# Patient Record
Sex: Male | Born: 1995 | Race: Black or African American | Hispanic: No | Smoking: Current some day smoker
Health system: Southern US, Community
[De-identification: ages and names within clinical notes are randomized; demographics above are authoritative.]

## PROBLEM LIST (undated history)

## (undated) DIAGNOSIS — J45909 Unspecified asthma, uncomplicated: Secondary | ICD-10-CM

## (undated) DIAGNOSIS — Z91018 Allergy to other foods: Secondary | ICD-10-CM

## (undated) DIAGNOSIS — Z9109 Other allergy status, other than to drugs and biological substances: Secondary | ICD-10-CM

---

## 2015-03-19 ENCOUNTER — Emergency Department (INDEPENDENT_AMBULATORY_CARE_PROVIDER_SITE_OTHER)
Admission: EM | Admit: 2015-03-19 | Discharge: 2015-03-19 | Disposition: A | Discharge status: Home / Self Care | Payer: Self-pay | Source: Home / Self Care | Attending: Family Medicine | Admitting: Family Medicine

## 2015-03-19 ENCOUNTER — Encounter (HOSPITAL_COMMUNITY): Payer: Self-pay | Admitting: Family Medicine

## 2015-03-19 DIAGNOSIS — R509 Fever, unspecified: Secondary | ICD-10-CM

## 2015-03-19 DIAGNOSIS — B349 Viral infection, unspecified: Secondary | ICD-10-CM

## 2015-03-19 HISTORY — DX: Unspecified asthma, uncomplicated: J45.909

## 2015-03-19 HISTORY — DX: Other allergy status, other than to drugs and biological substances: Z91.09

## 2015-03-19 HISTORY — DX: Allergy to other foods: Z91.018

## 2015-03-19 LAB — POCT RAPID STREP A: STREPTOCOCCUS, GROUP A SCREEN (DIRECT): NEGATIVE

## 2015-03-19 MED ORDER — IBUPROFEN 800 MG PO TABS
ORAL_TABLET | ORAL | Status: AC
  Filled 2015-03-19: qty 1

## 2015-03-19 MED ORDER — IBUPROFEN 800 MG PO TABS
800.0000 mg | ORAL_TABLET | Freq: Once | ORAL | Status: AC
  Administered 2015-03-19: 800 mg via ORAL

## 2015-03-19 MED ORDER — IBUPROFEN 800 MG PO TABS: 800.0000 mg | ORAL_TABLET | Freq: Once | ORAL | Status: DC

## 2015-03-19 NOTE — ED Notes (Signed)
C/o sore throat, body aches, chills and dizzy at times onset early this morning.  C/o feeling weak.

## 2015-03-19 NOTE — ED Notes (Signed)
[facility]  Pt. does not know the phone number.

## 2015-03-19 NOTE — ED Provider Notes (Signed)
CSN: [id number]     Arrival date & time 03/19/15  1746 History   First MD Initiated Contact with Patient 03/19/15 1817     Chief Complaint  Patient presents with  . Generalized Body Aches  . Sore Throat   (Consider location/radiation/quality/duration/timing/severity/associated sxs/prior Treatment) HPI  Back pain. Current episode started today. Off and on for 18 mo. unsure of trigger. Tylenol w/o benefit.  Also w/ dizziness and lightheadedness. Typically associated w/ going from sitting or lying to standing.  Associated w/ chills and generalized body aches, sore throat Denies nausea, vomiting, vomiting, dysuria, frequency, headache, runny nose, upper story congestion No drug use for 4 mo.    Past Medical History  Diagnosis Date  . Environmental allergies   . Asthma    History reviewed. No pertinent past surgical history. Family History  Problem Relation Age of Onset  . Asthma Mother   . Hypertension Father    History  Substance Use Topics  . Smoking status: Former Smoker    Quit date: 12/09/2014  . Smokeless tobacco: Not on file  . Alcohol Use: Not on file    Review of Systems Per HPI with all other pertinent systems negative.   Allergies  Review of patient's allergies indicates no known allergies.  Home Medications   Prior to Admission medications   Not on File   There were no vitals taken for this visit. Physical Exam Physical Exam  Constitutional: oriented to person, place, and time. appears well-developed and well-nourished. No distress.  HENT:  Head: Normocephalic and atraumatic.  Tonsils 2+ with pharyngeal injection Eyes: EOMI. PERRL.  Neck: Normal range of motion.  Cardiovascular: RRR, no m/r/g, 2+ distal pulses,  Pulmonary/Chest: Effort normal and breath sounds normal. No respiratory distress.  Abdominal: Soft. Bowel sounds are normal. NonTTP, no distension.  Musculoskeletal: Normal range of motion. Non ttp, no effusion.  Neurological: alert and  oriented to person, place, and time.  Skin: Skin is warm. No rash noted. non diaphoretic.  Psychiatric: normal mood and affect. behavior is normal. Judgment and thought content normal.      ED Course  Procedures (including critical care time) Labs Review Labs Reviewed  POCT RAPID STREP A ([facility] ONLY)    Imaging Review No results found.   MDM   1. Viral illness   2. Febrile illness    Rapid strep neg Motrin 800 mg by mouth given in clinic. Orthostatic vital signs normal Patient to get plenty of rest, use anti-inflammatories for pain and fevers, stable hydrated. Work note provided.      Ozella Rocksavid J Merrell, MD 03/19/15 [phone]

## 2015-03-19 NOTE — Discharge Instructions (Signed)
You are likely suffering from a viral illness. We will call you if your second strep test comes back positive for infection Please get plenty of rest and stay well hydrated.  Please use ibuprofen 800mg  every 8 hours for pain and fevers.

## 2015-03-21 LAB — CULTURE, GROUP A STREP: Strep A Culture: NEGATIVE

## 2015-07-12 ENCOUNTER — Emergency Department (INDEPENDENT_AMBULATORY_CARE_PROVIDER_SITE_OTHER): Payer: Self-pay

## 2015-07-12 ENCOUNTER — Emergency Department (INDEPENDENT_AMBULATORY_CARE_PROVIDER_SITE_OTHER)
Admission: EM | Admit: 2015-07-12 | Discharge: 2015-07-12 | Disposition: A | Discharge status: Home / Self Care | Payer: Self-pay | Source: Home / Self Care | Attending: Family Medicine | Admitting: Family Medicine

## 2015-07-12 ENCOUNTER — Encounter (HOSPITAL_COMMUNITY): Payer: Self-pay | Admitting: *Deleted

## 2015-07-12 DIAGNOSIS — S86892A Other injury of other muscle(s) and tendon(s) at lower leg level, left leg, initial encounter: Secondary | ICD-10-CM

## 2015-07-12 NOTE — Discharge Instructions (Signed)
Wear knee brace when walking, may remove in bed. See dr Thurston Hole at 10 am on thurs am for further care.

## 2015-07-12 NOTE — ED Provider Notes (Signed)
[id number]     Arrival date & time 07/12/15  1413 History   First MD Initiated Contact with Patient 07/12/15 1432     Chief Complaint  Patient presents with  . Knee Pain   (Consider location/radiation/quality/duration/timing/severity/associated sxs/prior Treatment) Patient is a 19 y.o. male presenting with knee pain. The history is provided by the patient.  Knee Pain Location:  Knee Time since incident:  3 weeks Injury: yes   Mechanism of injury comment:  Jumped off a loading dock. Knee location:  L knee Pain details:    Quality:  Sharp   Radiates to:  Does not radiate   Severity:  Moderate   Onset quality:  Gradual   Progression:  Worsening Chronicity:  New Dislocation: no   Relieved by:  None tried Worsened by:  Nothing tried Ineffective treatments:  None tried Associated symptoms: decreased ROM, muscle weakness and swelling   Associated symptoms: no numbness     Past Medical History  Diagnosis Date  . Environmental allergies   . Asthma   . Pollen allergies   . Nut allergy    History reviewed. No pertinent past surgical history. Family History  Problem Relation Age of Onset  . Asthma Mother   . Hypertension Father    History  Substance Use Topics  . Smoking status: Former Smoker    Quit date: 11/11/2014  . Smokeless tobacco: Not on file  . Alcohol Use: No    Review of Systems  Constitutional: Negative.   Musculoskeletal: Positive for joint swelling and gait problem.  Skin: Negative.     Allergies  Review of patient's allergies indicates no known allergies.  Home Medications   Prior to Admission medications   Medication Sig Start Date End Date Taking? Authorizing Provider  albuterol (PROVENTIL HFA;VENTOLIN HFA) 108 (90 BASE) MCG/ACT inhaler Inhale into the lungs every 6 (six) hours as needed for wheezing or shortness of breath.    Historical Provider, MD  ibuprofen (ADVIL,MOTRIN) 800 MG tablet Take 1 tablet (800 mg total) by mouth once. 03/19/15    Ozella Rocks, MD   BP 115/69 mmHg  Pulse 66  Temp(Src) 97.8 F (36.6 C) (Oral)  Resp 16  SpO2 99% Physical Exam  Constitutional: He is oriented to person, place, and time. He appears well-developed and well-nourished. No distress.  Musculoskeletal: He exhibits tenderness.       Left knee: He exhibits decreased range of motion, swelling and abnormal patellar mobility. He exhibits no effusion, no erythema, normal alignment, no LCL laxity, normal meniscus and no MCL laxity. Tenderness found. Patellar tendon tenderness noted. No medial joint line and no lateral joint line tenderness noted.  Neurological: He is alert and oriented to person, place, and time.  Skin: Skin is warm and dry.  Nursing note and vitals reviewed.   ED Course  Procedures (including critical care time) Labs Review Labs Reviewed - No data to display  Imaging Review Dg Knee Ap/lat W/sunrise Left  07/12/2015   CLINICAL DATA:  Patellar pain.  Jumping injury.  Initial encounter.  EXAM: LEFT KNEE 3 VIEWS  COMPARISON:  None.  FINDINGS: There is no evidence of fracture, dislocation, or joint effusion. There is no evidence of arthropathy or other focal bone abnormality. Soft tissues are unremarkable.  IMPRESSION: Negative.   Electronically Signed   By: Andreas Newport M.D.   On: 07/12/2015 15:14    X-rays reviewed and report per radiologist.  MDM   1. Patellar tendon avulsion, left, initial encounter  Linna Hoff, MD 07/12/15 [phone]

## 2015-07-12 NOTE — ED Notes (Signed)
Pt   Reports       Pain      l  Knee  For    About 3   Weeks         He  States  He  Sustained  An injury    From  Jumping   Off  A    Loading  Dock                He states     Pain is  Worse  On  Weight  Bearing

## 2016-03-29 ENCOUNTER — Encounter (HOSPITAL_COMMUNITY): Payer: Self-pay | Admitting: *Deleted

## 2016-03-29 ENCOUNTER — Emergency Department (HOSPITAL_COMMUNITY)
Admission: EM | Admit: 2016-03-29 | Discharge: 2016-03-29 | Disposition: A | Discharge status: Home / Self Care | Payer: No Typology Code available for payment source | Attending: Emergency Medicine | Admitting: Emergency Medicine

## 2016-03-29 ENCOUNTER — Emergency Department (HOSPITAL_COMMUNITY): Payer: No Typology Code available for payment source

## 2016-03-29 DIAGNOSIS — Y92410 Unspecified street and highway as the place of occurrence of the external cause: Secondary | ICD-10-CM | POA: Diagnosis not present

## 2016-03-29 DIAGNOSIS — M79651 Pain in right thigh: Secondary | ICD-10-CM

## 2016-03-29 DIAGNOSIS — S79922A Unspecified injury of left thigh, initial encounter: Secondary | ICD-10-CM | POA: Insufficient documentation

## 2016-03-29 DIAGNOSIS — S79921A Unspecified injury of right thigh, initial encounter: Secondary | ICD-10-CM | POA: Diagnosis not present

## 2016-03-29 DIAGNOSIS — S79912A Unspecified injury of left hip, initial encounter: Secondary | ICD-10-CM | POA: Insufficient documentation

## 2016-03-29 DIAGNOSIS — Y998 Other external cause status: Secondary | ICD-10-CM | POA: Diagnosis not present

## 2016-03-29 DIAGNOSIS — Z79899 Other long term (current) drug therapy: Secondary | ICD-10-CM | POA: Insufficient documentation

## 2016-03-29 DIAGNOSIS — M25559 Pain in unspecified hip: Secondary | ICD-10-CM

## 2016-03-29 DIAGNOSIS — M79652 Pain in left thigh: Secondary | ICD-10-CM

## 2016-03-29 DIAGNOSIS — S79911A Unspecified injury of right hip, initial encounter: Secondary | ICD-10-CM | POA: Insufficient documentation

## 2016-03-29 DIAGNOSIS — Y9355 Activity, bike riding: Secondary | ICD-10-CM | POA: Diagnosis not present

## 2016-03-29 DIAGNOSIS — Z87891 Personal history of nicotine dependence: Secondary | ICD-10-CM | POA: Diagnosis not present

## 2016-03-29 DIAGNOSIS — J45909 Unspecified asthma, uncomplicated: Secondary | ICD-10-CM | POA: Insufficient documentation

## 2016-03-29 MED ORDER — METHOCARBAMOL 500 MG PO TABS: 500.0000 mg | ORAL_TABLET | Freq: Two times a day (BID) | ORAL | Status: DC | PRN

## 2016-03-29 MED ORDER — IBUPROFEN 800 MG PO TABS: 800.0000 mg | ORAL_TABLET | Freq: Three times a day (TID) | ORAL | Status: DC
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
## 2016-03-29 NOTE — ED Notes (Signed)
PA at bedside.
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
## 2016-03-29 NOTE — Discharge Instructions (Signed)
Rest, ice affected area (instructions below), take ibuprofen for pain. Robaxin is your muscle relaxer - This can make you very drowsy - please do not drink or drive on this medication.  COLD THERAPY DIRECTIONS:  Ice or gel packs can be used to reduce both pain and swelling. Ice is the most helpful within the first 24 to 48 hours after an injury or flareup from overusing a muscle or joint.  Ice is effective, has very few side effects, and is safe for most people to use.   If you expose your skin to cold temperatures for too long or without the proper protection, you can damage your skin or nerves. Watch for signs of skin damage due to cold.   HOME CARE INSTRUCTIONS  Follow these tips to use ice and cold packs safely.  Place a dry or damp towel between the ice and skin. A damp towel will cool the skin more quickly, so you may need to shorten the time that the ice is used.  For a more rapid response, add gentle compression to the ice.  Ice for no more than 10 to 20 minutes at a time. The bonier the area you are icing, the less time it will take to get the benefits of ice.  Check your skin after 5 minutes to make sure there are no signs of a poor response to cold or skin damage.  Rest 20 minutes or more in between uses.  Once your skin is numb, you can end your treatment. You can test numbness by very lightly touching your skin. The touch should be so light that you do not see the skin dimple from the pressure of your fingertip. When using ice, most people will feel these normal sensations in this order: cold, burning, aching, and numbness.
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
## 2019-04-24 ENCOUNTER — Other Ambulatory Visit: Payer: Self-pay

## 2019-04-24 ENCOUNTER — Ambulatory Visit (HOSPITAL_COMMUNITY)
Admission: EM | Admit: 2019-04-24 | Discharge: 2019-04-24 | Disposition: A | Discharge status: Home / Self Care | Payer: Self-pay

## 2019-04-24 DIAGNOSIS — K0889 Other specified disorders of teeth and supporting structures: Secondary | ICD-10-CM

## 2019-04-24 NOTE — Discharge Instructions (Signed)
May continue with tylenol and ibuprofen every 6-8 hours as needed for pain. Take with food.  Ice application may help as well.  Please follow up with a dentist for definitive treatment.

## 2019-04-24 NOTE — ED Provider Notes (Signed)
[facility]    CSN: [id number] Arrival date & time: 04/24/19  1653     History   Chief Complaint Chief Complaint  Patient presents with  . Dental Pain    HPI Travis Braun is a 23 y.o. male.   Travis Braun presents with complaints of right lower dental pain which can cause right sided face pain when it is severe. Worse over the past few days. Wisdom tooth is pushing through and also has a cracked molar- cracked months ago. No obvious known drainage from the area. No fevers. No facial swelling. Pain is currently under control after taking two tylenol and 1 ibuprofen. This has helped. Does not have a dentist. Denies any previous similar. Hx of asthma and allergies.    ROS per HPI, negative if not otherwise mentioned.      Past Medical History:  Diagnosis Date  . Asthma   . Environmental allergies   . Nut allergy   . Pollen allergies     There are no active problems to display for this patient.   No past surgical history on file.     Home Medications    Prior to Admission medications   Medication Sig Start Date End Date Taking? Authorizing Provider  albuterol (PROVENTIL HFA;VENTOLIN HFA) 108 (90 BASE) MCG/ACT inhaler Inhale into the lungs every 6 (six) hours as needed for wheezing or shortness of breath.    [provider]  ibuprofen (ADVIL,MOTRIN) 800 MG tablet Take 1 tablet (800 mg total) by mouth 3 (three) times daily. 03/29/16   Ward, Chase PicketJaime Pilcher, PA-C  methocarbamol (ROBAXIN) 500 MG tablet Take 1 tablet (500 mg total) by mouth 2 (two) times daily as needed for muscle spasms. 03/29/16   Ward, Chase PicketJaime Pilcher, PA-C    Family History Family History  Problem Relation Age of Onset  . Asthma Mother   . Hypertension Father     Social History Social History   Tobacco Use  . Smoking status: Former Smoker    Last attempt to quit: 11/11/2014    Years since quitting: 4.4  Substance Use Topics  . Alcohol use: No  . Drug use: No      Allergies   Pollen extract   Review of Systems Review of Systems   Physical Exam Triage Vital Signs ED Triage Vitals  Enc Vitals Group     BP 04/24/19 1712 123/77     Pulse Rate 04/24/19 1712 62     Resp 04/24/19 1712 16     Temp 04/24/19 1712 98 F (36.7 C)     Temp Source 04/24/19 1712 Oral     SpO2 04/24/19 1712 100 %     Weight --      Height --      Head Circumference --      Peak Flow --      Pain Score 04/24/19 1711 10     Pain Loc --      Pain Edu? --      Excl. in GC? --    No data found.  Updated Vital Signs BP 123/77 (BP Location: Right Arm)   Pulse 62   Temp 98 F (36.7 C) (Oral)   Resp 16   SpO2 100%    Physical Exam Constitutional:      Appearance: He is well-developed.  HENT:     Mouth/Throat:     Dentition: Gingival swelling present.     Comments: Right lower wisdom tooth with gum still overlying the  medial aspect, no drainage, mild swelling to the area; no obvious or visible abscess presence; no jaw tenderness on palpation; no swelling to face or soft tissue  Cardiovascular:     Rate and Rhythm: Normal rate and regular rhythm.  Pulmonary:     Effort: Pulmonary effort is normal.     Breath sounds: Normal breath sounds.  Skin:    General: Skin is warm and dry.  Neurological:     Mental Status: He is alert and oriented to person, place, and time.      UC Treatments / Results  Labs (all labs ordered are listed, but only abnormal results are displayed) Labs Reviewed - No data to display  EKG None  Radiology No results found.  Procedures Procedures (including critical care time)  Medications Ordered in UC Medications - No data to display  Initial Impression / Assessment and Plan / UC Course  I have reviewed the triage vital signs and the nursing notes.  Pertinent labs & imaging results that were available during my care of the patient were reviewed by me and considered in my medical decision making (see chart for details).      Benign exam. Pain is currently under control. No obvious infectious source at this time. Pain management discussed. Encouraged to follow with a dentist for definitive treatment. Patient verbalized understanding and agreeable to plan.   Final Clinical Impressions(s) / UC Diagnoses   Final diagnoses:  Pain, dental     Discharge Instructions     May continue with tylenol and ibuprofen every 6-8 hours as needed for pain. Take with food.  Ice application may help as well.  Please follow up with a dentist for definitive treatment.    ED Prescriptions    None     Controlled Substance Prescriptions [place] Controlled Substance Registry consulted? Not Applicable   Georgetta Haber, NP 04/24/19 1752

## 2019-04-24 NOTE — ED Triage Notes (Signed)
Per pt he has been having tooth pain in the right back tooth. He said hard to chew on that side.

## 2019-12-15 ENCOUNTER — Emergency Department (HOSPITAL_COMMUNITY)
Admission: EM | Admit: 2019-12-15 | Discharge: 2019-12-15 | Disposition: A | Discharge status: Home / Self Care | Payer: PRIVATE HEALTH INSURANCE | Attending: Emergency Medicine | Admitting: Emergency Medicine

## 2019-12-15 ENCOUNTER — Encounter (HOSPITAL_COMMUNITY): Payer: Self-pay | Admitting: *Deleted

## 2019-12-15 DIAGNOSIS — Z87891 Personal history of nicotine dependence: Secondary | ICD-10-CM | POA: Insufficient documentation

## 2019-12-15 DIAGNOSIS — Z79899 Other long term (current) drug therapy: Secondary | ICD-10-CM | POA: Insufficient documentation

## 2019-12-15 DIAGNOSIS — R059 Cough, unspecified: Secondary | ICD-10-CM

## 2019-12-15 DIAGNOSIS — R05 Cough: Secondary | ICD-10-CM | POA: Insufficient documentation

## 2019-12-15 DIAGNOSIS — Z20822 Contact with and (suspected) exposure to covid-19: Secondary | ICD-10-CM | POA: Insufficient documentation

## 2019-12-15 LAB — SARS CORONAVIRUS 2 (TAT 6-24 HRS): SARS Coronavirus 2: NEGATIVE

## 2019-12-15 NOTE — ED Provider Notes (Signed)
[facility] EMERGENCY DEPARTMENT Provider Note   CSN: [id number] Arrival date & time: 12/15/19  0900     History Chief Complaint  Patient presents with  . Cough    Travis Braun is a 24 y.o. male.  Patient is a 24 year old male with a past medical history of asthma and allergies.  He arrives to the ED with a 2-day history of nonproductive cough and fever T-max of 101.  He has been treating with DayQuil during the day and NyQuil at night.  Patient states that he feels better but he wanted to come to the emergency department just to make sure everything was normal.  He has no other symptoms.  Patient denies any sick contacts no Covid exposures.        Past Medical History:  Diagnosis Date  . Asthma   . Environmental allergies   . Nut allergy   . Pollen allergies     There are no problems to display for this patient.   History reviewed. No pertinent surgical history.     Family History  Problem Relation Age of Onset  . Asthma Mother   . Hypertension Father     Social History   Tobacco Use  . Smoking status: Former Smoker    Quit date: 11/11/2014    Years since quitting: 5.0  Substance Use Topics  . Alcohol use: No  . Drug use: No    Home Medications Prior to Admission medications   Medication Sig Start Date End Date Taking? Authorizing Provider  albuterol (PROVENTIL HFA;VENTOLIN HFA) 108 (90 BASE) MCG/ACT inhaler Inhale into the lungs every 6 (six) hours as needed for wheezing or shortness of breath.    [provider]  ibuprofen (ADVIL,MOTRIN) 800 MG tablet Take 1 tablet (800 mg total) by mouth 3 (three) times daily. 03/29/16   Ward, Chase Picket, PA-C  methocarbamol (ROBAXIN) 500 MG tablet Take 1 tablet (500 mg total) by mouth 2 (two) times daily as needed for muscle spasms. 03/29/16   Ward, Chase Picket, PA-C    Allergies    Pollen extract  Review of Systems   Review of Systems  Constitutional: Positive for fever. Negative  for chills.  HENT: Negative for ear pain and sore throat.   Eyes: Negative for pain and discharge.  Respiratory: Positive for cough. Negative for shortness of breath and wheezing.   Cardiovascular: Negative for chest pain.  Gastrointestinal: Negative for abdominal pain, diarrhea, nausea and vomiting.  Musculoskeletal: Negative for arthralgias and myalgias.  Skin: Negative for rash and wound.  Allergic/Immunologic: Positive for environmental allergies.  Neurological: Negative for dizziness, seizures and headaches.  Hematological: Negative for adenopathy.  All other systems reviewed and are negative.   Physical Exam Updated Vital Signs BP 122/76 (BP Location: Right Arm)   Pulse 72   Temp 98.6 F (37 C) (Oral)   Resp 18   SpO2 100%   Physical Exam Vitals and nursing note reviewed.  Constitutional:      General: He is not in acute distress.    Appearance: Normal appearance. He is normal weight. He is not ill-appearing or toxic-appearing.  HENT:     Head: Normocephalic and atraumatic.     Right Ear: Tympanic membrane, ear canal and external ear normal.     Left Ear: Tympanic membrane, ear canal and external ear normal.     Nose: Nose normal.     Mouth/Throat:     Mouth: Mucous membranes are moist.  Pharynx: Oropharynx is clear. No oropharyngeal exudate or posterior oropharyngeal erythema.  Eyes:     Extraocular Movements: Extraocular movements intact.     Conjunctiva/sclera: Conjunctivae normal.     Pupils: Pupils are equal, round, and reactive to light.  Cardiovascular:     Rate and Rhythm: Normal rate and regular rhythm.     Pulses: Normal pulses.     Heart sounds: Normal heart sounds.  Pulmonary:     Effort: Pulmonary effort is normal. No accessory muscle usage, prolonged expiration, respiratory distress or retractions.     Breath sounds: Normal breath sounds and air entry. No stridor or decreased air movement. No decreased breath sounds or wheezing.  Abdominal:      General: Abdomen is flat. Bowel sounds are normal. There is no distension.     Palpations: Abdomen is soft.     Tenderness: There is no abdominal tenderness.  Musculoskeletal:        General: Normal range of motion.     Cervical back: Normal range of motion and neck supple.  Skin:    General: Skin is warm and dry.     Capillary Refill: Capillary refill takes less than 2 seconds.  Neurological:     General: No focal deficit present.     Mental Status: He is alert and oriented to person, place, and time. Mental status is at baseline.     Cranial Nerves: No cranial nerve deficit.  Psychiatric:        Mood and Affect: Mood normal.     ED Results / Procedures / Treatments   Labs (all labs ordered are listed, but only abnormal results are displayed) Labs Reviewed  SARS CORONAVIRUS 2 (TAT 6-24 HRS)    EKG None  Radiology No results found.  Procedures Procedures (including critical care time)  Medications Ordered in ED Medications - No data to display  ED Course  I have reviewed the triage vital signs and the nursing notes.  Pertinent labs & imaging results that were available during my care of the patient were reviewed by me and considered in my medical decision making (see chart for details).  Travis Braun was evaluated in Emergency Department on 12/15/2019 for the symptoms described in the history of present illness. He was evaluated in the context of the global COVID-19 pandemic, which necessitated consideration that the patient might be at risk for infection with the SARS-CoV-2 virus that causes COVID-19. Institutional protocols and algorithms that pertain to the evaluation of patients at risk for COVID-19 are in a state of rapid change based on information released by regulatory bodies including the CDC and federal and state organizations. These policies and algorithms were followed during the patient's care in the ED.     MDM Rules/Calculators/A&P                       Patient alert and oriented on exam and in no acute distress. He has a history of asthma but has not needed any medications. Travis Braun reports a non-productive cough x2 days with temp of 102 at home.   His neuro exam unremarkable.  Lungs clear to auscultation bilaterally, no wheezing, good aeration throughout all lung fields with no signs of respiratory distress.  Patient afebrile in the emergency department, respiratory exam not concerning for pneumonia. Heart sounds normal. Abdomen is soft/flat/non distended and non-tender. He denies body aches, N/V/D. He states normal PO intake with normal UOP, cap refill <2 second, mucus membranes pink and  moist.   Patient has been treating with dayquil and Nyquil at home with relief of symptoms. He will continue to treat cold symptoms at home. Outpatient COVID swab sent, discussed isolation precautions at home until results confirmed. Patient verbalizes understanding. Will follow up with PCP. Strict return precautions provided, such as increased WOB, shortness of breath, or chest pain.     Final Clinical Impression(s) / ED Diagnoses Final diagnoses:  Cough    Rx / DC Orders ED Discharge Orders    None       Orma Flaming, NP 12/15/19 1009    Blane Ohara, MD 12/15/19 1028

## 2019-12-15 NOTE — ED Notes (Signed)
Isolation and quarantine rules and timeline reviewed and pt expresses understanding and willingness to comply. Denies further questions at this time.

## 2019-12-15 NOTE — Discharge Instructions (Addendum)
Please isolate at home until results of COVID tests, which will be called to you if you are positive. Continue treatment at home with current regiment, increase fluid intake to avoid dehydration. Please follow up with your primary care provider for any new or worsening symptoms.

## 2019-12-15 NOTE — ED Notes (Signed)
Discharge given from doorway to minimize contact and conserve PPE. Mom expressed understanding of discharge and denies any further questions or needs at this time.  

## 2019-12-15 NOTE — ED Triage Notes (Signed)
To ED for eval of cough and congestion for past few days. States he took otc cold meds without relief. Denies fevers. Appears in nad

## 2020-08-22 ENCOUNTER — Emergency Department (HOSPITAL_COMMUNITY)
Admission: EM | Admit: 2020-08-22 | Discharge: 2020-08-22 | Disposition: A | Discharge status: Home / Self Care | Payer: No Typology Code available for payment source | Attending: Emergency Medicine | Admitting: Emergency Medicine

## 2020-08-22 ENCOUNTER — Encounter (HOSPITAL_COMMUNITY): Payer: Self-pay | Admitting: Emergency Medicine

## 2020-08-22 DIAGNOSIS — U071 COVID-19: Secondary | ICD-10-CM | POA: Insufficient documentation

## 2020-08-22 DIAGNOSIS — Z5321 Procedure and treatment not carried out due to patient leaving prior to being seen by health care provider: Secondary | ICD-10-CM | POA: Insufficient documentation

## 2020-08-22 LAB — SARS CORONAVIRUS 2 BY RT PCR (HOSPITAL ORDER, PERFORMED IN ~~LOC~~ HOSPITAL LAB): SARS Coronavirus 2: POSITIVE — AB

## 2020-08-22 MED ORDER — ACETAMINOPHEN 325 MG PO TABS
650.0000 mg | ORAL_TABLET | Freq: Once | ORAL | Status: AC | PRN
  Administered 2020-08-22: 650 mg via ORAL
  Filled 2020-08-22: qty 2

## 2020-08-22 NOTE — ED Notes (Signed)
No response for bed times 3

## 2020-08-22 NOTE — ED Triage Notes (Signed)
Pt reports he was around his sig other who tested pos for covid, c/o cough, chills, sweats. Febrile in triage.

## 2020-08-24 ENCOUNTER — Telehealth (HOSPITAL_COMMUNITY): Payer: Self-pay

## 2020-08-24 ENCOUNTER — Other Ambulatory Visit: Payer: Self-pay | Admitting: Oncology

## 2020-08-24 DIAGNOSIS — U071 COVID-19: Secondary | ICD-10-CM

## 2020-08-24 NOTE — Progress Notes (Signed)
I connected by phone with  Mr. Wert to discuss the potential use of an new treatment for mild to moderate COVID-19 viral infection in non-hospitalized patients.   This patient is a age/sex that meets the FDA criteria for Emergency Use Authorization of casirivimab\imdevimab.  Has a (+) direct SARS-CoV-2 viral test result 1. Has mild or moderate COVID-19  2. Is ? 26 years of age and weighs ? 40 kg 3. Is NOT hospitalized due to COVID-19 4. Is NOT requiring oxygen therapy or requiring an increase in baseline oxygen flow rate due to COVID-19 5. Is within 10 days of symptom onset 6. Has at least one of the high risk factor(s) for progression to severe COVID-19 and/or hospitalization as defined in EUA. Specific high risk criteria : Past Medical History:  Diagnosis Date  . Asthma   . Environmental allergies   . Nut allergy   . Pollen allergies   ?  ?    Symptom onset  08/19/2020   I have spoken and communicated the following to the patient or parent/caregiver:   1. FDA has authorized the emergency use of casirivimab\imdevimab for the treatment of mild to moderate COVID-19 in adults and pediatric patients with positive results of direct SARS-CoV-2 viral testing who are 68 years of age and older weighing at least 40 kg, and who are at high risk for progressing to severe COVID-19 and/or hospitalization.   2. The significant known and potential risks and benefits of casirivimab\imdevimab, and the extent to which such potential risks and benefits are unknown.   3. Information on available alternative treatments and the risks and benefits of those alternatives, including clinical trials.   4. Patients treated with casirivimab\imdevimab should continue to self-isolate and use infection control measures (e.g., wear mask, isolate, social distance, avoid sharing personal items, clean and disinfect "high touch" surfaces, and frequent handwashing) according to CDC guidelines.    5. The patient or  parent/caregiver has the option to accept or refuse casirivimab\imdevimab .   After reviewing this information with the patient, The patient agreed to proceed with receiving casirivimab\imdevimab infusion and will be provided a copy of the Fact sheet prior to receiving the infusion.Mignon Pine, AGNP-C [phone] (Infusion Center Hotline)

## 2020-08-26 ENCOUNTER — Ambulatory Visit (HOSPITAL_COMMUNITY)
Admission: RE | Admit: 2020-08-26 | Discharge: 2020-08-26 | Disposition: A | Discharge status: Home / Self Care | Payer: HRSA Program | Source: Ambulatory Visit | Attending: Pulmonary Disease | Admitting: Pulmonary Disease

## 2020-08-26 DIAGNOSIS — U071 COVID-19: Secondary | ICD-10-CM | POA: Insufficient documentation

## 2020-08-26 MED ORDER — DIPHENHYDRAMINE HCL 50 MG/ML IJ SOLN: 50.0000 mg | Freq: Once | INTRAMUSCULAR | Status: DC | PRN

## 2020-08-26 MED ORDER — EPINEPHRINE 0.3 MG/0.3ML IJ SOAJ: 0.3000 mg | Freq: Once | INTRAMUSCULAR | Status: DC | PRN

## 2020-08-26 MED ORDER — SODIUM CHLORIDE 0.9 % IV SOLN
1200.0000 mg | Freq: Once | INTRAVENOUS | Status: AC
  Administered 2020-08-26: 1200 mg via INTRAVENOUS

## 2020-08-26 MED ORDER — ALBUTEROL SULFATE HFA 108 (90 BASE) MCG/ACT IN AERS: 2.0000 | INHALATION_SPRAY | Freq: Once | RESPIRATORY_TRACT | Status: DC | PRN

## 2020-08-26 MED ORDER — METHYLPREDNISOLONE SODIUM SUCC 125 MG IJ SOLR: 125.0000 mg | Freq: Once | INTRAMUSCULAR | Status: DC | PRN

## 2020-08-26 MED ORDER — SODIUM CHLORIDE 0.9 % IV SOLN: INTRAVENOUS | Status: DC | PRN

## 2020-08-26 MED ORDER — FAMOTIDINE IN NACL 20-0.9 MG/50ML-% IV SOLN: 20.0000 mg | Freq: Once | INTRAVENOUS | Status: DC | PRN

## 2020-08-26 NOTE — Discharge Instructions (Signed)

## 2020-08-26 NOTE — Progress Notes (Addendum)
  Diagnosis: COVID-19  Physician:Dr Wright  Procedure: Covid Infusion Clinic Med: casirivimab\imdevimab infusion - Provided patient with casirivimab\imdevimab fact sheet for patients, parents and caregivers prior to infusion.  Complications: No immediate complications noted.  Discharge: Discharged home   Travis Braun 08/26/2020  

## 2020-12-27 ENCOUNTER — Other Ambulatory Visit: Payer: Self-pay

## 2020-12-27 ENCOUNTER — Ambulatory Visit (HOSPITAL_COMMUNITY)
Admission: EM | Admit: 2020-12-27 | Discharge: 2020-12-27 | Disposition: A | Discharge status: Home / Self Care | Payer: HRSA Program | Attending: Emergency Medicine | Admitting: Emergency Medicine

## 2020-12-27 DIAGNOSIS — Z20822 Contact with and (suspected) exposure to covid-19: Secondary | ICD-10-CM | POA: Diagnosis present

## 2020-12-27 DIAGNOSIS — R52 Pain, unspecified: Secondary | ICD-10-CM | POA: Insufficient documentation

## 2020-12-27 DIAGNOSIS — R059 Cough, unspecified: Secondary | ICD-10-CM | POA: Diagnosis present

## 2020-12-27 LAB — SARS CORONAVIRUS 2 (TAT 6-24 HRS): SARS Coronavirus 2: NEGATIVE

## 2020-12-27 NOTE — ED Triage Notes (Signed)
Pt c/o cough for 2 days, chills, body aches, hoarseness, onset yesterday. Girlfriend positive for COVID 3 days ago.  Denies v/d, HA, fever.   Has been taking nyquil, dayquil-last taken yesterday. No covid vaccines. Has been positive for COVID previously.

## 2020-12-27 NOTE — ED Provider Notes (Signed)
[facility]    CSN: [id number] Arrival date & time: 12/27/20  4008      History   Chief Complaint Chief Complaint  Patient presents with  . Chills  . Cough    HPI Travis Braun is a 25 y.o. male.   Travis Braun presents with complaints of body aches, back ache, some cough, which started two days ago. No known fevers. Nausea this morning. No vomiting or diarrhea. No skin rash. No nasal drainage or sore throat. His girlfriend tested positive for covid-19 three days ago. He has had covid in the past (sept 2021), has not been vaccinated for covid. Took dayquil this morning which did seem to help. No shortness of breath .     ROS per HPI, negative if not otherwise mentioned.      Past Medical History:  Diagnosis Date  . Asthma   . Environmental allergies   . Nut allergy   . Pollen allergies     There are no problems to display for this patient.   No past surgical history on file.     Home Medications    Prior to Admission medications   Medication Sig Start Date End Date Taking? Authorizing Provider  albuterol (PROVENTIL HFA;VENTOLIN HFA) 108 (90 BASE) MCG/ACT inhaler Inhale into the lungs every 6 (six) hours as needed for wheezing or shortness of breath.    [provider]  ibuprofen (ADVIL,MOTRIN) 800 MG tablet Take 1 tablet (800 mg total) by mouth 3 (three) times daily. 03/29/16   Ward, Chase Picket, PA-C  methocarbamol (ROBAXIN) 500 MG tablet Take 1 tablet (500 mg total) by mouth 2 (two) times daily as needed for muscle spasms. 03/29/16   Ward, Chase Picket, PA-C    Family History Family History  Problem Relation Age of Onset  . Asthma Mother   . Hypertension Father     Social History Social History   Tobacco Use  . Smoking status: Former Smoker    Quit date: 11/11/2014    Years since quitting: 6.1  Substance Use Topics  . Alcohol use: No  . Drug use: No     Allergies   Pollen extract   Review of Systems Review of  Systems   Physical Exam Triage Vital Signs ED Triage Vitals  Enc Vitals Group     BP 12/27/20 0851 125/75     Pulse Rate 12/27/20 0851 74     Resp 12/27/20 0851 16     Temp 12/27/20 0851 97.8 F (36.6 C)     Temp Source 12/27/20 0851 Oral     SpO2 12/27/20 0851 99 %     Weight 12/27/20 0849 175 lb (79.4 kg)     Height 12/27/20 0849 6' (1.829 m)     Head Circumference --      Peak Flow --      Pain Score 12/27/20 0849 7     Pain Loc --      Pain Edu? --      Excl. in GC? --    No data found.  Updated Vital Signs BP 125/75 (BP Location: Right Arm)   Pulse 74   Temp 97.8 F (36.6 C) (Oral)   Resp 16   Ht 6' (1.829 m)   Wt 175 lb (79.4 kg)   SpO2 99%   BMI 23.73 kg/m   Visual Acuity Right Eye Distance:   Left Eye Distance:   Bilateral Distance:    Right Eye Near:   Left Eye  Near:    Bilateral Near:     Physical Exam Constitutional:      Appearance: He is well-developed.  Cardiovascular:     Rate and Rhythm: Normal rate.  Pulmonary:     Effort: Pulmonary effort is normal.  Skin:    General: Skin is warm and dry.  Neurological:     Mental Status: He is alert and oriented to person, place, and time.      UC Treatments / Results  Labs (all labs ordered are listed, but only abnormal results are displayed) Labs Reviewed  SARS CORONAVIRUS 2 (TAT 6-24 HRS)    EKG   Radiology No results found.  Procedures Procedures (including critical care time)  Medications Ordered in UC Medications - No data to display  Initial Impression / Assessment and Plan / UC Course  I have reviewed the triage vital signs and the nursing notes.  Pertinent labs & imaging results that were available during my care of the patient were reviewed by me and considered in my medical decision making (see chart for details).     Non toxic. Benign physical exam.  History and physical consistent with viral illness.  Supportive cares recommended. Covid testing pending and  isolation instructions provided.  Return precautions provided. Patient verbalized understanding and agreeable to plan.    Final Clinical Impressions(s) / UC Diagnoses   Final diagnoses:  Body aches  Cough  Exposure to COVID-19 virus     Discharge Instructions     Self isolate until covid results are back.  We will notify you by phone if it is positive. Your negative results will be sent through your MyChart.    If it is positive you need to isolate from others for a total of 5 days. If no fever for 24 hours without medications, and symptoms improving you may end isolation on day 6, but wear a mask if around any others for an additional 5 days.   Tylenol and/or ibuprofen as needed for pain or fevers.  Rest.  Push fluids to ensure adequate hydration and keep secretions thin.  You may try some vitamins to help your immune system potentially:  Vitamin C 500mg  twice a day. Zinc 50mg  daily. Vitamin D 5000IU daily.   If symptoms worsen or do not improve in the next week to return to be seen or to follow up with your PCP.     ED Prescriptions    None     PDMP not reviewed this encounter.   , NP 12/27/20 [phone]

## 2020-12-27 NOTE — Discharge Instructions (Signed)
Self isolate until covid results are back.  We will notify you by phone if it is positive. Your negative results will be sent through your MyChart.    If it is positive you need to isolate from others for a total of 5 days. If no fever for 24 hours without medications, and symptoms improving you may end isolation on day 6, but wear a mask if around any others for an additional 5 days.   Tylenol and/or ibuprofen as needed for pain or fevers.  Rest.  Push fluids to ensure adequate hydration and keep secretions thin.  You may try some vitamins to help your immune system potentially:  Vitamin C 500mg  twice a day. Zinc 50mg  daily. Vitamin D 5000IU daily.   If symptoms worsen or do not improve in the next week to return to be seen or to follow up with your PCP.

## 2021-07-25 ENCOUNTER — Other Ambulatory Visit: Payer: Self-pay

## 2021-07-25 ENCOUNTER — Encounter (HOSPITAL_COMMUNITY): Payer: Self-pay | Admitting: Emergency Medicine

## 2021-07-25 ENCOUNTER — Ambulatory Visit (HOSPITAL_COMMUNITY)
Admission: EM | Admit: 2021-07-25 | Discharge: 2021-07-25 | Disposition: A | Discharge status: Home / Self Care | Payer: Self-pay | Attending: Student | Admitting: Student

## 2021-07-25 DIAGNOSIS — S61211A Laceration without foreign body of left index finger without damage to nail, initial encounter: Secondary | ICD-10-CM

## 2021-07-25 DIAGNOSIS — Z23 Encounter for immunization: Secondary | ICD-10-CM

## 2021-07-25 MED ORDER — TETANUS-DIPHTH-ACELL PERTUSSIS 5-2.5-18.5 LF-MCG/0.5 IM SUSY
0.5000 mL | PREFILLED_SYRINGE | Freq: Once | INTRAMUSCULAR | Status: AC
  Administered 2021-07-25: 0.5 mL via INTRAMUSCULAR

## 2021-07-25 MED ORDER — TETANUS-DIPHTH-ACELL PERTUSSIS 5-2.5-18.5 LF-MCG/0.5 IM SUSY
PREFILLED_SYRINGE | INTRAMUSCULAR | Status: AC
  Filled 2021-07-25: qty 0.5

## 2021-07-25 NOTE — Discharge Instructions (Addendum)
-  I applied Dermabond glue today. This will help your laceration heal well. It's waterproof, so you can still wash your hands with gentle soap and water, and shower. Avoid hydrogen peroxide or alcohol to cleanse. You can cover with a bandaid if you want to, but you don't have to. Dermabond comes off on its own in about 3-4 days.  -Come back and see [place] if the wound is getting worse: new redness, swelling, pain, discharge, etc.

## 2021-07-25 NOTE — ED Provider Notes (Signed)
[facility]    CSN: [id number] Arrival date & time: 07/25/21  6213      History   Chief Complaint Chief Complaint  Patient presents with   Laceration    HPI Travis Braun is a 25 y.o. male presenting with laceration- L index finger x3 hours. Medical history noncontributory. Cut finger with knife while preparing meal. Some tingling to distal aspect, resolving. Tdap not UTD. He is right handed.  HPI  Past Medical History:  Diagnosis Date   Asthma    Environmental allergies    Nut allergy    Pollen allergies     There are no problems to display for this patient.   History reviewed. No pertinent surgical history.     Home Medications    Prior to Admission medications   Not on File    Family History Family History  Problem Relation Age of Onset   Asthma Mother    Hypertension Father     Social History Social History   Tobacco Use   Smoking status: Some Days    Types: Cigarettes    Last attempt to quit: 11/11/2014    Years since quitting: 6.7   Smokeless tobacco: Never  Vaping Use   Vaping Use: Some days  Substance Use Topics   Alcohol use: Yes   Drug use: No     Allergies   Pollen extract   Review of Systems Review of Systems  Skin:  Positive for wound.  All other systems reviewed and are negative.   Physical Exam Triage Vital Signs ED Triage Vitals  Enc Vitals Group     BP 07/25/21 1010 115/63     Pulse Rate 07/25/21 1010 63     Resp 07/25/21 1010 18     Temp 07/25/21 1010 98.2 F (36.8 C)     Temp Source 07/25/21 1010 Oral     SpO2 07/25/21 1010 100 %     Weight --      Height --      Head Circumference --      Peak Flow --      Pain Score 07/25/21 1007 5     Pain Loc --      Pain Edu? --      Excl. in GC? --    No data found.  Updated Vital Signs BP 115/63 (BP Location: Right Arm)   Pulse 63   Temp 98.2 F (36.8 C) (Oral)   Resp 18   SpO2 100%   Visual Acuity Right Eye Distance:   Left Eye Distance:    Bilateral Distance:    Right Eye Near:   Left Eye Near:    Bilateral Near:     Physical Exam Vitals reviewed.  Constitutional:      Appearance: Normal appearance.  HENT:     Head: Normocephalic and atraumatic.  Pulmonary:     Effort: Pulmonary effort is normal.  Skin:    Capillary Refill: Capillary refill takes less than 2 seconds.     Findings: Laceration present.     Comments: See image below Medial aspect of middle phalanx L index finger with small 55mm laceration, not actively bleeding. ROM PIP and DIP intact and without pain or effusion. Sensation intact. Cap refill <2 seconds, radial pulse 2+, grip strength 5/5.   Neurological:     General: No focal deficit present.     Mental Status: He is alert and oriented to person, place, and time.  Psychiatric:  Mood and Affect: Mood normal.        Behavior: Behavior normal.        Thought Content: Thought content normal.        Judgment: Judgment normal.       UC Treatments / Results  Labs (all labs ordered are listed, but only abnormal results are displayed) Labs Reviewed - No data to display  EKG   Radiology No results found.  Procedures Laceration Repair  Date/Time: 07/25/2021 11:03 AM Performed by: Rhys Martini, PA-C Authorized by: Rhys Martini, PA-C   Consent:    Consent obtained:  Verbal   Consent given by:  Patient   Risks, benefits, and alternatives were discussed: yes     Risks discussed:  Infection, need for additional repair, poor cosmetic result, pain and poor wound healing   Alternatives discussed:  No treatment Universal protocol:    Procedure explained and questions answered to patient or proxy's satisfaction: yes     Patient identity confirmed:  Verbally with patient Anesthesia:    Anesthesia method:  None Laceration details:    Location:  Finger   Finger location:  L index finger   Length (cm):  1 Treatment:    Area cleansed with:  Povidone-iodine   Amount of cleaning:   Standard   Irrigation solution:  Sterile saline   Debridement:  None   Undermining:  None Skin repair:    Repair method:  Tissue adhesive Repair type:    Repair type:  Simple Post-procedure details:    Dressing:  Open (no dressing)   Procedure completion:  Tolerated well, no immediate complications (including critical care time)  Medications Ordered in UC Medications  Tdap (BOOSTRIX) injection 0.5 mL (has no administration in time range)    Initial Impression / Assessment and Plan / UC Course  I have reviewed the triage vital signs and the nursing notes.  Pertinent labs & imaging results that were available during my care of the patient were reviewed by me and considered in my medical decision making (see chart for details).     This patient is a very pleasant 25 y.o. year old male presenting with laceration L index finger, sustained 3 hours ago. Dermabond applied as above. Tdap administered. Wound care provided and discussed .ED return precautions discussed. Patient verbalizes understanding and agreement.  .   Final Clinical Impressions(s) / UC Diagnoses   Final diagnoses:  Laceration of left index finger without foreign body without damage to nail, initial encounter  Need for Tdap vaccination     Discharge Instructions      -I applied Dermabond glue today. This will help your laceration heal well. It's waterproof, so you can still wash your hands with gentle soap and water, and shower. Avoid hydrogen peroxide or alcohol to cleanse. You can cover with a bandaid if you want to, but you don't have to. Dermabond comes off on its own in about 3-4 days.  -Come back and see [place] if the wound is getting worse: new redness, swelling, pain, discharge, etc.      ED Prescriptions   None    PDMP not reviewed this encounter.   Rhys Martini, PA-C 07/25/21 1104

## 2021-07-25 NOTE — ED Triage Notes (Signed)
Ts cutting left index finger 2 hours ago while fixing a meal.  Unknown last tetanus.  Patient able to bend finger, tingling fingertip.

## 2024-01-06 ENCOUNTER — Other Ambulatory Visit: Payer: Self-pay

## 2024-01-06 ENCOUNTER — Emergency Department (HOSPITAL_BASED_OUTPATIENT_CLINIC_OR_DEPARTMENT_OTHER): Payer: Self-pay

## 2024-01-06 ENCOUNTER — Encounter (HOSPITAL_BASED_OUTPATIENT_CLINIC_OR_DEPARTMENT_OTHER): Payer: Self-pay | Admitting: Emergency Medicine

## 2024-01-06 ENCOUNTER — Emergency Department (HOSPITAL_BASED_OUTPATIENT_CLINIC_OR_DEPARTMENT_OTHER)
Admission: EM | Admit: 2024-01-06 | Discharge: 2024-01-06 | Disposition: A | Discharge status: Home / Self Care | Payer: Self-pay | Attending: Emergency Medicine | Admitting: Emergency Medicine

## 2024-01-06 DIAGNOSIS — W25XXXA Contact with sharp glass, initial encounter: Secondary | ICD-10-CM | POA: Insufficient documentation

## 2024-01-06 DIAGNOSIS — S91311A Laceration without foreign body, right foot, initial encounter: Secondary | ICD-10-CM | POA: Insufficient documentation

## 2024-01-06 NOTE — ED Triage Notes (Signed)
Stepped on glass last week with R foot. States he has been attempting to care for it at home but it does not seem to be getting better. Bandage in place.

## 2024-01-06 NOTE — Discharge Instructions (Signed)
Please follow with your family doctor in the office.  Warm compresses at least 4 times a day over the area that we talked about.  For the wound I would have you do the ointment 2 times a day.  Please return for rapid spreading redness or if you develop a fever.

## 2024-01-06 NOTE — ED Notes (Signed)
Discharge paperwork given and verbally understood.

## 2024-01-06 NOTE — ED Provider Notes (Signed)
[place] [facility] AT [facility] Provider Note   CSN: [id number] Arrival date & time: 01/06/24  0920     History  Chief Complaint  Patient presents with   Foot Injury    Travis Braun is a 28 y.o. male.  28 yo M with a with a chief complaints of a laceration to the lateral aspect of the right foot.  This happened about 4 days ago.  He has been trying to take care of it at home but he was worried because it has not healed yet.  He denies any redness fevers or drainage.  He does not think that there was a retained foreign body.   Foot Injury      Home Medications Prior to Admission medications   Not on File      Allergies    Pollen extract    Review of Systems   Review of Systems  Physical Exam Updated Vital Signs BP (!) 140/91 (BP Location: Right Arm)   Pulse 81   Temp 98 F (36.7 C)   Resp 16   SpO2 100%  Physical Exam Vitals and nursing note reviewed.  Constitutional:      Appearance: He is well-developed.  HENT:     Head: Normocephalic and atraumatic.  Eyes:     Pupils: Pupils are equal, round, and reactive to light.  Neck:     Vascular: No JVD.  Cardiovascular:     Rate and Rhythm: Normal rate and regular rhythm.     Heart sounds: No murmur heard.    No friction rub. No gallop.  Pulmonary:     Effort: No respiratory distress.     Breath sounds: No wheezing.  Abdominal:     General: There is no distension.     Tenderness: There is no abdominal tenderness. There is no guarding or rebound.  Musculoskeletal:        General: Normal range of motion.     Cervical back: Normal range of motion and neck supple.     Comments: Laceration to the lateral aspect of the right foot.  Approximately 6 cm in length.  Appears clean.  No appreciable drainage no erythema.  No warmth.  Skin:    Coloration: Skin is not pale.     Findings: No rash.  Neurological:     Mental Status: He is alert and oriented to person, place, and time.   Psychiatric:        Behavior: Behavior normal.     ED Results / Procedures / Treatments   Labs (all labs ordered are listed, but only abnormal results are displayed) Labs Reviewed - No data to display  EKG None  Radiology DG Foot Complete Right Result Date: 01/06/2024 CLINICAL DATA:  Recent laceration from glass. EXAM: RIGHT FOOT COMPLETE - 3 VIEW COMPARISON:  None Available. FINDINGS: No fracture or dislocation. Preserved joint spaces and bone mineralization. There is a small radiopaque density overlying the soft tissues plantar and lateral to the foot at the level of the head of the fifth metatarsal. Please correlate for location of injury. A subtle radiopaque foreign body is possible. IMPRESSION: small radiopaque density overlying the soft tissues plantar and lateral to the foot at the level of the head of the fifth metatarsal. Please correlate for location of injury. A subtle radiopaque foreign body is possible. Electronically Signed   By: Karen Kays M.D.   On: 01/06/2024 11:51    Procedures Procedures    Medications Ordered in ED  Medications - No data to display  ED Course/ Medical Decision Making/ A&P                                 Medical Decision Making Amount and/or Complexity of Data Reviewed Radiology: ordered.   28 yo M with a chief complaints of a laceration to his right foot.  This occurred about 4 days ago.  The wound looks pretty well cared for.  The patient was worried because it has not healed yet.  I discussed with him the typical timeline for this.  Will obtain a plain film to assess for possible foreign body.  His record was reviewed he did have his tetanus updated about 3 years ago.   Plain film of the foot independently interpreted by me without obvious foreign body in the area of his laceration however there is a likely foreign body about the lateral aspect of the foot adjacent to the fifth metatarsal.  On further discussion with the patient and he  said he was having some discomfort there.  He has a small healed laceration to the area.  I am not able to palpate any obvious foreign body.  Not sure that the risk outweighs the benefit for local exploration.  Will have him try warm compresses.  PCP follow-up.  11:56 AM:  I have discussed the diagnosis/risks/treatment options with the patient.  Evaluation and diagnostic testing in the emergency department does not suggest an emergent condition requiring admission or immediate intervention beyond what has been performed at this time.  They will follow up with PCP. We also discussed returning to the ED immediately if new or worsening sx occur. We discussed the sx which are most concerning (e.g., sudden worsening pain, fever, inability to tolerate by mouth, redness, drainage) that necessitate immediate return. Medications administered to the patient during their visit and any new prescriptions provided to the patient are listed below.  Medications given during this visit Medications - No data to display   The patient appears reasonably screen and/or stabilized for discharge and I doubt any other medical condition or other [facility] requiring further screening, evaluation, or treatment in the ED at this time prior to discharge.          Final Clinical Impression(s) / ED Diagnoses Final diagnoses:  Laceration of right foot, initial encounter    Rx / DC Orders ED Discharge Orders     None         Melene Plan, DO 01/06/24 1156
